# Patient Record
Sex: Male | Born: 1995 | Race: Black or African American | Hispanic: No | Marital: Single | Smoking: Never smoker
Health system: Southern US, Community
[De-identification: ages and names within clinical notes are randomized; demographics above are authoritative.]

## PROBLEM LIST (undated history)

## (undated) DIAGNOSIS — I10 Essential (primary) hypertension: Secondary | ICD-10-CM

## (undated) DIAGNOSIS — J45909 Unspecified asthma, uncomplicated: Secondary | ICD-10-CM

---

## 2020-11-11 ENCOUNTER — Emergency Department (HOSPITAL_COMMUNITY): Payer: Medicare HMO

## 2020-11-11 ENCOUNTER — Other Ambulatory Visit: Payer: Self-pay

## 2020-11-11 ENCOUNTER — Emergency Department (HOSPITAL_COMMUNITY)
Admission: EM | Admit: 2020-11-11 | Discharge: 2020-11-11 | Disposition: A | Discharge status: Home / Self Care | Payer: Medicare HMO | Attending: Emergency Medicine | Admitting: Emergency Medicine

## 2020-11-11 ENCOUNTER — Encounter (HOSPITAL_COMMUNITY): Payer: Self-pay

## 2020-11-11 DIAGNOSIS — Z5321 Procedure and treatment not carried out due to patient leaving prior to being seen by health care provider: Secondary | ICD-10-CM | POA: Diagnosis not present

## 2020-11-11 DIAGNOSIS — J45901 Unspecified asthma with (acute) exacerbation: Secondary | ICD-10-CM | POA: Diagnosis not present

## 2020-11-11 DIAGNOSIS — J45909 Unspecified asthma, uncomplicated: Secondary | ICD-10-CM

## 2020-11-11 HISTORY — DX: Unspecified asthma, uncomplicated: J45.909

## 2020-11-11 HISTORY — DX: Essential (primary) hypertension: I10

## 2020-11-11 LAB — CBC WITH DIFFERENTIAL/PLATELET
Abs Immature Granulocytes: 0.02 10*3/uL (ref 0.00–0.07)
Basophils Absolute: 0 10*3/uL (ref 0.0–0.1)
Basophils Relative: 0 %
Eosinophils Absolute: 0.1 10*3/uL (ref 0.0–0.5)
Eosinophils Relative: 1 %
HCT: 47.2 % (ref 39.0–52.0)
Hemoglobin: 16 g/dL (ref 13.0–17.0)
Immature Granulocytes: 0 %
Lymphocytes Relative: 28 %
Lymphs Abs: 2.2 10*3/uL (ref 0.7–4.0)
MCH: 31.7 pg (ref 26.0–34.0)
MCHC: 33.9 g/dL (ref 30.0–36.0)
MCV: 93.5 fL (ref 80.0–100.0)
Monocytes Absolute: 0.8 10*3/uL (ref 0.1–1.0)
Monocytes Relative: 10 %
Neutro Abs: 4.9 10*3/uL (ref 1.7–7.7)
Neutrophils Relative %: 61 %
Platelets: 274 10*3/uL (ref 150–400)
RBC: 5.05 MIL/uL (ref 4.22–5.81)
RDW: 13.1 % (ref 11.5–15.5)
WBC: 7.9 10*3/uL (ref 4.0–10.5)
nRBC: 0 % (ref 0.0–0.2)

## 2020-11-11 LAB — BASIC METABOLIC PANEL
Anion gap: 8 (ref 5–15)
BUN: 6 mg/dL (ref 6–20)
CO2: 26 mmol/L (ref 22–32)
Calcium: 8.8 mg/dL — ABNORMAL LOW (ref 8.9–10.3)
Chloride: 104 mmol/L (ref 98–111)
Creatinine, Ser: 1.23 mg/dL (ref 0.61–1.24)
GFR, Estimated: >60 mL/min (ref >60)
Glucose, Bld: 107 mg/dL — ABNORMAL HIGH (ref 70–99)
Potassium: 3.5 mmol/L (ref 3.5–5.1)
Sodium: 138 mmol/L (ref 135–145)

## 2020-11-11 LAB — TROPONIN I (HIGH SENSITIVITY): Troponin I (High Sensitivity): 6 ng/L (ref <18)

## 2020-11-11 MED ORDER — ALBUTEROL SULFATE HFA 108 (90 BASE) MCG/ACT IN AERS: 2.0000 | INHALATION_SPRAY | Freq: Once | RESPIRATORY_TRACT | Status: DC

## 2020-11-11 NOTE — ED Triage Notes (Signed)
Patient complains of asthma attack this am. States that he awoke wheezing, felt hot and coughing. Used inhaler with some relief and nebulizer broke. Patient in NAD

## 2020-11-11 NOTE — ED Provider Notes (Signed)
Emergency Medicine Provider Triage Evaluation Note  Jonathan Yates , a 25 y.o. male  was evaluated in triage.  Pt complains of asthma exacerbation.  Patient reports asthma attack began this morning, reports wheezing and a hot flash along with nonproductive cough.  Patient uses albuterol inhaler at home with some relief.  He then attempted to use his nebulizer but found that it was broken.  Review of Systems  Positive: Shortness of breath, cough Negative: Fever/chills, recent illness, difficulty swallowing, hemoptysis, chest pain, [name], vomiting, diarrhea, extremity swelling/color change or any additional concerns.  Physical Exam  BP (!) 150/103   Pulse 95   Temp 97.8 F (36.6 C) (Oral)   Resp (!) 26   SpO2 99%  Gen:   Awake, no distress  Resp:  Normal effort, slightly diminished lung sounds bilaterally. MSK:   Moves extremities without difficulty, no lower extremity edema Other:  Heart regular rate and rhythm  Medical Decision Making  Medically screening exam initiated at 9:15 AM.  Appropriate orders placed.  Jonathan Yates was informed that the remainder of the evaluation will be completed by another provider, this initial triage assessment does not replace that evaluation, and the importance of remaining in the ED until their evaluation is complete.   Note: Portions of this report may have been transcribed using voice recognition software. Every effort was made to ensure accuracy; however, inadvertent computerized transcription errors may still be present.    Elizabeth Palau 11/11/20 1779    Terrilee Files, MD 11/11/20 2112

## 2020-11-11 NOTE — ED Notes (Signed)
In lobby now

## 2020-11-11 NOTE — ED Notes (Signed)
Called no answer

## 2020-11-11 NOTE — ED Notes (Signed)
Pt not responding for vital recheck. 

## 2020-11-11 NOTE — ED Notes (Signed)
Pt didn't answer when called to recheck vitals  

## 2022-02-01 IMAGING — DX DG CHEST 2V
3 series · 3 of 3 positions shown · non-contrast
Comparison: None.

CLINICAL DATA: Asthma, shortness of breath

EXAM:
CHEST - 2 VIEW

[chest pa]
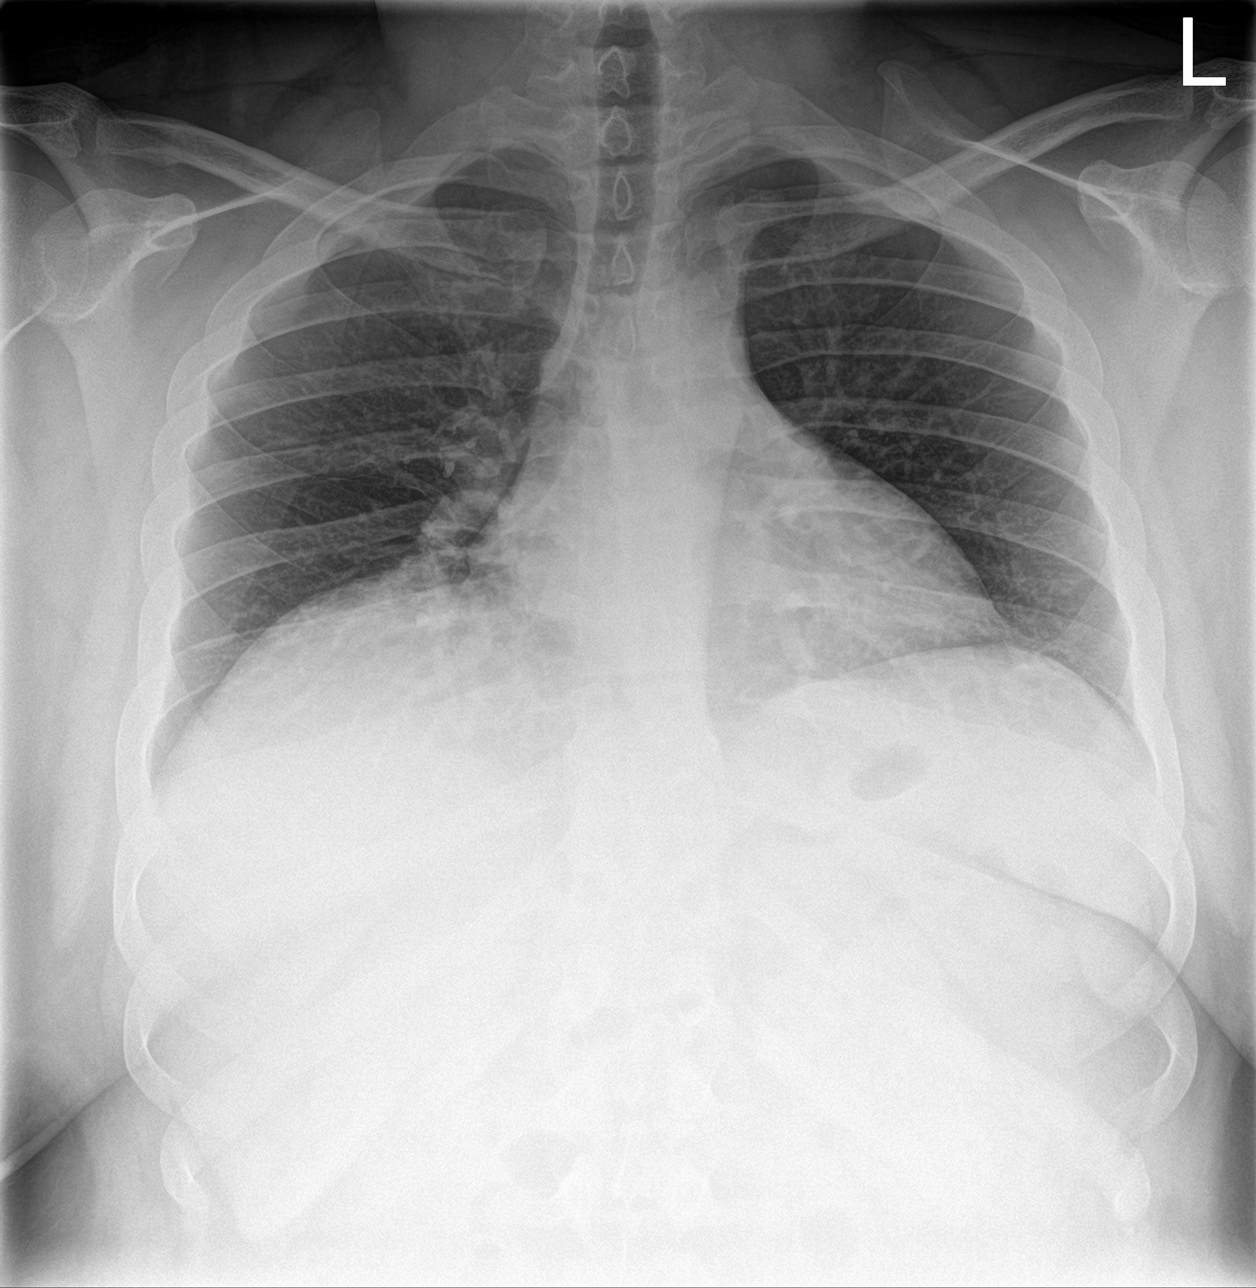

[chest lat (1 of 2)]
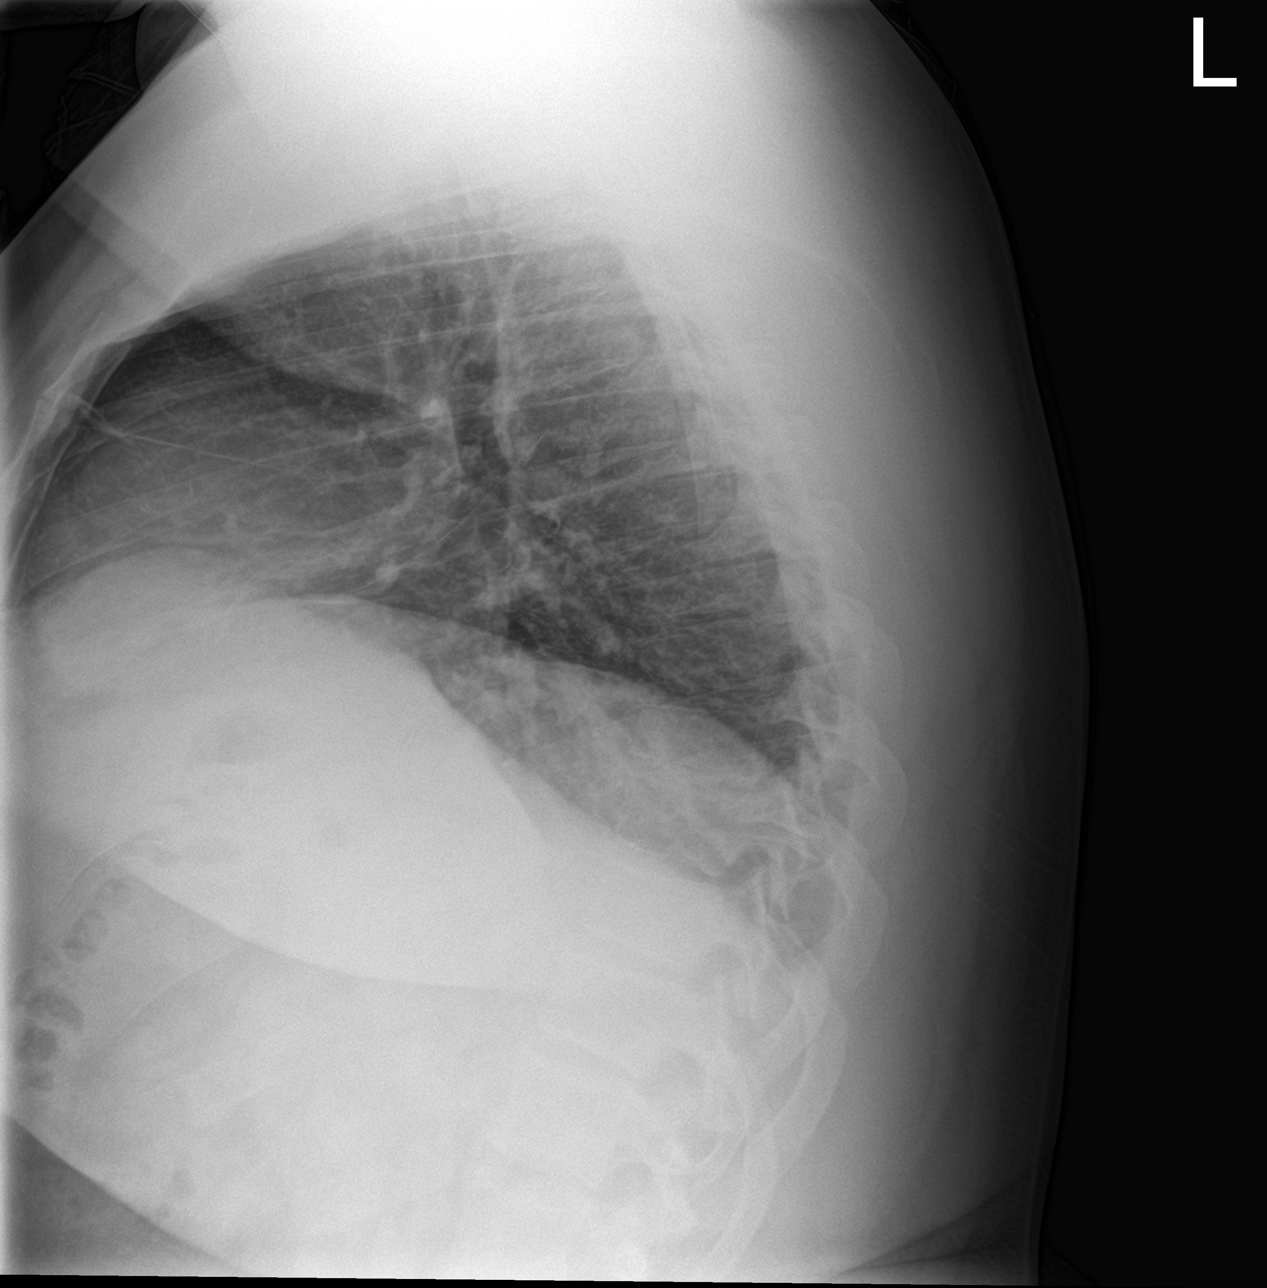

[chest lat (2 of 2)]
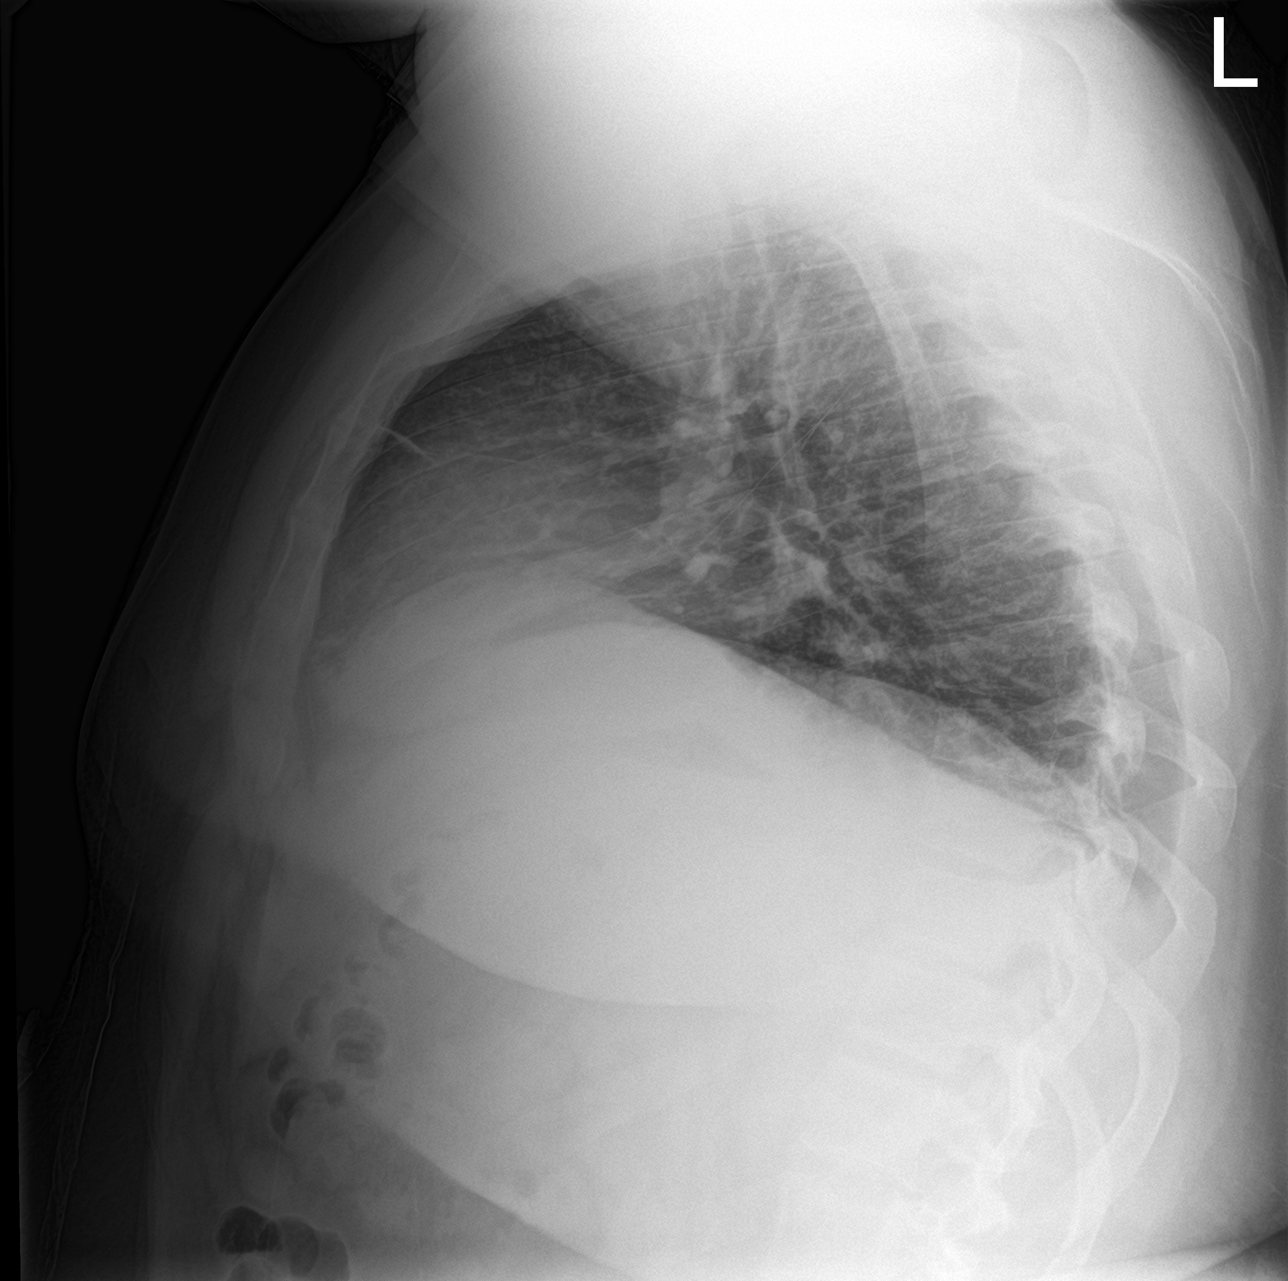

[3 of 3 positions shown; findings below may reference images not displayed]

FINDINGS: Very low lung volumes. No consolidation or edema. No pleural
effusion or pneumothorax. Cardiomediastinal contours are within
normal limits. No acute osseous abnormality.
IMPRESSION: Very low lung volumes.  No acute process in the chest.

## 2022-08-16 ENCOUNTER — Ambulatory Visit (HOSPITAL_COMMUNITY)
Admission: EM | Admit: 2022-08-16 | Discharge: 2022-08-16 | Disposition: A | Discharge status: Home / Self Care | Payer: 59

## 2022-08-16 ENCOUNTER — Encounter (HOSPITAL_COMMUNITY): Payer: Self-pay | Admitting: Emergency Medicine

## 2022-08-16 ENCOUNTER — Other Ambulatory Visit: Payer: Self-pay

## 2022-08-16 ENCOUNTER — Ambulatory Visit (INDEPENDENT_AMBULATORY_CARE_PROVIDER_SITE_OTHER): Payer: 59

## 2022-08-16 DIAGNOSIS — M542 Cervicalgia: Secondary | ICD-10-CM | POA: Diagnosis not present

## 2022-08-16 DIAGNOSIS — S161XXA Strain of muscle, fascia and tendon at neck level, initial encounter: Secondary | ICD-10-CM

## 2022-08-16 MED ORDER — PREDNISONE 10 MG (21) PO TBPK: ORAL_TABLET | Freq: Every day | ORAL | 0 refills | Status: AC

## 2022-08-16 MED ORDER — METHOCARBAMOL 500 MG PO TABS: 500.0000 mg | ORAL_TABLET | Freq: Three times a day (TID) | ORAL | 0 refills | Status: AC

## 2022-08-16 NOTE — Discharge Instructions (Addendum)
Advised patient of neck x-ray results with hardcopy provided.  Instructed patient to take medications as directed with food to completion.  Advised may use Robaxin daily or as needed for neck muscle spasms.  Encouraged increased daily water intake to 64 ounces per day while taking these medication.  Advised if symptoms worsen and/or unresolved please follow-up with PCP or here for further evaluation.
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
## 2022-08-20 ENCOUNTER — Emergency Department (HOSPITAL_COMMUNITY)
Admission: EM | Admit: 2022-08-20 | Discharge: 2022-08-20 | Disposition: A | Discharge status: Home / Self Care | Payer: 59 | Attending: Emergency Medicine | Admitting: Emergency Medicine

## 2022-08-20 ENCOUNTER — Encounter (HOSPITAL_COMMUNITY): Payer: Self-pay

## 2022-08-20 ENCOUNTER — Other Ambulatory Visit: Payer: Self-pay

## 2022-08-20 DIAGNOSIS — S161XXA Strain of muscle, fascia and tendon at neck level, initial encounter: Secondary | ICD-10-CM | POA: Diagnosis not present

## 2022-08-20 DIAGNOSIS — R519 Headache, unspecified: Secondary | ICD-10-CM

## 2022-08-20 DIAGNOSIS — S199XXA Unspecified injury of neck, initial encounter: Secondary | ICD-10-CM | POA: Diagnosis present

## 2022-08-20 DIAGNOSIS — Y92410 Unspecified street and highway as the place of occurrence of the external cause: Secondary | ICD-10-CM | POA: Diagnosis not present

## 2022-08-20 DIAGNOSIS — Z79899 Other long term (current) drug therapy: Secondary | ICD-10-CM | POA: Diagnosis not present

## 2022-08-20 DIAGNOSIS — I10 Essential (primary) hypertension: Secondary | ICD-10-CM | POA: Insufficient documentation

## 2022-08-20 MED ORDER — IBUPROFEN 200 MG PO TABS
400.0000 mg | ORAL_TABLET | Freq: Once | ORAL | Status: AC
  Administered 2022-08-20: 400 mg via ORAL
  Filled 2022-08-20: qty 2

## 2022-08-20 MED ORDER — AMLODIPINE BESYLATE 5 MG PO TABS
5.0000 mg | ORAL_TABLET | Freq: Once | ORAL | Status: AC
  Administered 2022-08-20: 5 mg via ORAL
  Filled 2022-08-20: qty 1

## 2022-08-20 MED ORDER — ACETAMINOPHEN 500 MG PO TABS
1000.0000 mg | ORAL_TABLET | Freq: Once | ORAL | Status: AC
  Administered 2022-08-20: 1000 mg via ORAL
  Filled 2022-08-20: qty 2

## 2022-08-20 NOTE — ED Notes (Signed)
Pt educated regarding HTN and importance of following up with PCP and getting norvasc from home. Pt states understanding.
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
## 2022-08-20 NOTE — Discharge Instructions (Addendum)
It was our pleasure to provide your ER care today - we hope that you feel better.  Take acetaminophen or ibuprofen as need for pain. Take your muscle relaxer (robaxin) as need, as previously prescribed.   Your blood pressure is high, continue your meds, follow heart health eating plan, and follow up closely with your doctor in the next 1-2 weeks.   Return to ER if worse, new symptoms, fevers, new/severe pain, severe headache, severe abdominal pain, trouble breathing, numbness/weakness, or other concern.

## 2022-10-02 ENCOUNTER — Emergency Department (HOSPITAL_COMMUNITY): Payer: 59

## 2022-10-02 ENCOUNTER — Emergency Department (HOSPITAL_COMMUNITY)
Admission: EM | Admit: 2022-10-02 | Discharge: 2022-10-02 | Disposition: A | Discharge status: Home / Self Care | Payer: 59 | Attending: Emergency Medicine | Admitting: Emergency Medicine

## 2022-10-02 ENCOUNTER — Telehealth (HOSPITAL_COMMUNITY): Payer: Self-pay | Admitting: Emergency Medicine

## 2022-10-02 ENCOUNTER — Encounter (HOSPITAL_COMMUNITY): Payer: Self-pay

## 2022-10-02 DIAGNOSIS — J45909 Unspecified asthma, uncomplicated: Secondary | ICD-10-CM | POA: Insufficient documentation

## 2022-10-02 DIAGNOSIS — Z79899 Other long term (current) drug therapy: Secondary | ICD-10-CM | POA: Insufficient documentation

## 2022-10-02 DIAGNOSIS — K0889 Other specified disorders of teeth and supporting structures: Secondary | ICD-10-CM

## 2022-10-02 DIAGNOSIS — K115 Sialolithiasis: Secondary | ICD-10-CM | POA: Diagnosis not present

## 2022-10-02 DIAGNOSIS — I10 Essential (primary) hypertension: Secondary | ICD-10-CM | POA: Diagnosis not present

## 2022-10-02 LAB — CBC WITH DIFFERENTIAL/PLATELET
Abs Immature Granulocytes: 0.04 10*3/uL (ref 0.00–0.07)
Basophils Absolute: 0.1 10*3/uL (ref 0.0–0.1)
Basophils Relative: 0 %
Eosinophils Absolute: 0.2 10*3/uL (ref 0.0–0.5)
Eosinophils Relative: 1 %
HCT: 47.5 % (ref 39.0–52.0)
Hemoglobin: 16.5 g/dL (ref 13.0–17.0)
Immature Granulocytes: 0 %
Lymphocytes Relative: 33 %
Lymphs Abs: 3.9 10*3/uL (ref 0.7–4.0)
MCH: 30.9 pg (ref 26.0–34.0)
MCHC: 34.7 g/dL (ref 30.0–36.0)
MCV: 89 fL (ref 80.0–100.0)
Monocytes Absolute: 0.8 10*3/uL (ref 0.1–1.0)
Monocytes Relative: 7 %
Neutro Abs: 6.8 10*3/uL (ref 1.7–7.7)
Neutrophils Relative %: 59 %
Platelets: 286 10*3/uL (ref 150–400)
RBC: 5.34 MIL/uL (ref 4.22–5.81)
RDW: 12 % (ref 11.5–15.5)
WBC: 11.7 10*3/uL — ABNORMAL HIGH (ref 4.0–10.5)
nRBC: 0 % (ref 0.0–0.2)

## 2022-10-02 LAB — BASIC METABOLIC PANEL
Anion gap: 11 (ref 5–15)
BUN: 9 mg/dL (ref 6–20)
CO2: 18 mmol/L — ABNORMAL LOW (ref 22–32)
Calcium: 7.5 mg/dL — ABNORMAL LOW (ref 8.9–10.3)
Chloride: 109 mmol/L (ref 98–111)
Creatinine, Ser: 1.09 mg/dL (ref 0.61–1.24)
GFR, Estimated: >60 mL/min (ref >60)
Glucose, Bld: 119 mg/dL — ABNORMAL HIGH (ref 70–99)
Potassium: 3 mmol/L — ABNORMAL LOW (ref 3.5–5.1)
Sodium: 138 mmol/L (ref 135–145)

## 2022-10-02 MED ORDER — LIDOCAINE VISCOUS HCL 2 % MT SOLN: 10.0000 mL | Freq: Four times a day (QID) | OROMUCOSAL | 0 refills | Status: AC | PRN

## 2022-10-02 MED ORDER — FENTANYL CITRATE PF 50 MCG/ML IJ SOSY
50.0000 ug | PREFILLED_SYRINGE | Freq: Once | INTRAMUSCULAR | Status: AC
  Administered 2022-10-02: 50 ug via INTRAVENOUS
  Filled 2022-10-02: qty 1

## 2022-10-02 MED ORDER — BUPIVACAINE-EPINEPHRINE (PF) 0.5% -1:200000 IJ SOLN
1.8000 mL | Freq: Once | INTRAMUSCULAR | Status: AC
  Administered 2022-10-02: 1.8 mL
  Filled 2022-10-02: qty 1.8

## 2022-10-02 MED ORDER — KETOROLAC TROMETHAMINE 15 MG/ML IJ SOLN
15.0000 mg | Freq: Once | INTRAMUSCULAR | Status: AC
  Administered 2022-10-02: 15 mg via INTRAVENOUS
  Filled 2022-10-02: qty 1

## 2022-10-02 MED ORDER — PENICILLIN V POTASSIUM 500 MG PO TABS
500.0000 mg | ORAL_TABLET | Freq: Once | ORAL | Status: AC
  Administered 2022-10-02: 500 mg via ORAL
  Filled 2022-10-02: qty 1

## 2022-10-02 MED ORDER — PENICILLIN V POTASSIUM 500 MG PO TABS: 500.0000 mg | ORAL_TABLET | Freq: Four times a day (QID) | ORAL | 0 refills | Status: AC

## 2022-10-02 MED ORDER — IOHEXOL 300 MG/ML  SOLN
100.0000 mL | Freq: Once | INTRAMUSCULAR | Status: AC | PRN
  Administered 2022-10-02: 100 mL via INTRAVENOUS

## 2022-10-02 MED ORDER — ACETAMINOPHEN 500 MG PO TABS
1000.0000 mg | ORAL_TABLET | Freq: Once | ORAL | Status: AC
  Administered 2022-10-02: 1000 mg via ORAL
  Filled 2022-10-02: qty 2

## 2022-10-02 MED ORDER — PENICILLIN V POTASSIUM 500 MG PO TABS: 500.0000 mg | ORAL_TABLET | Freq: Four times a day (QID) | ORAL | 0 refills | Status: DC

## 2022-10-02 MED ORDER — SODIUM CHLORIDE (PF) 0.9 % IJ SOLN
INTRAMUSCULAR | Status: AC
  Filled 2022-10-02: qty 50
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
## 2022-10-02 NOTE — Telephone Encounter (Signed)
Received call from pharmacist that prescription written by previous provider has inconsistency with number of pills written for vs prescription instructions. Prescription written for penicillin V 500 mg QID x 7 days. 40 pills written. Will attempt to discontinue previous order in correction.   Pharmacist made aware via phone.

## 2022-10-02 NOTE — Discharge Instructions (Signed)
For pain control you may take 1000 mg of Tylenol every 8 hours scheduled.  In addition you can take 600 mg of Motrin every 6 - 8 hours as needed for pain not controlled with the scheduled Tylenol.

## 2022-10-02 NOTE — ED Provider Notes (Signed)
EMERGENCY DEPARTMENT AT [facility] Provider Note  CSN: [id number] Arrival date & time: 10/02/22 8413  Chief Complaint(s) Dental Pain  HPI Jonathan Yates is a 27 y.o. male with a past medical history listed below including hypertension and asthma chronic steroids resulting in dental decay requiring several dental extractions last of which was 6 months ago who presents to the emergency department with several days of left lower dental pain that became severe.  Patient feels like he is developing swelling underneath his jaw and is concerned for infection.  Pain is worse with palpation.  No noted fevers or chills.  No coughing or congestion.  No nausea or vomiting.  No other physical complaints.  The history is provided by the patient.    Past Medical History Past Medical History:  Diagnosis Date   Asthma    Hypertension    There are no problems to display for this patient.  Home Medication(s) Prior to Admission medications   Medication Sig Start Date End Date Taking? Authorizing Provider  albuterol (PROVENTIL) (2.5 MG/3ML) 0.083% nebulizer solution Take 2.5 mg by nebulization every 6 (six) hours as needed for wheezing or shortness of breath. 02/26/22  Yes [provider]  albuterol (VENTOLIN HFA) 108 (90 Base) MCG/ACT inhaler Inhale 1-2 puffs into the lungs every 6 (six) hours as needed for wheezing or shortness of breath. 02/26/22 04/13/23 Yes [provider]  amLODipine (NORVASC) 5 MG tablet Take 5 mg by mouth daily. 05/19/21 03/23/23 Yes [provider]  beclomethasone (QVAR REDIHALER) 80 MCG/ACT inhaler Inhale 1 puff into the lungs 2 (two) times daily. 04/15/22 04/15/23 Yes [provider]  penicillin v potassium (VEETID) 500 MG tablet Take 1 tablet (500 mg total) by mouth 4 (four) times daily for 7 days. 10/02/22 10/09/22 Yes Bertran Zeimet, Amadeo Garnet, MD  sertraline (ZOLOFT) 50 MG tablet Take 50 mg by mouth at bedtime. 05/19/21   Yes [provider]  Tiotropium Bromide Monohydrate 2.5 MCG/ACT AERS Inhale 2 each into the lungs daily. 01/05/22 04/13/23 Yes [provider]  atorvastatin (LIPITOR) 20 MG tablet Take 20 mg by mouth daily. 03/23/22   [provider]  methocarbamol (ROBAXIN) 500 MG tablet Take 1 tablet (500 mg total) by mouth 3 (three) times daily. Patient not taking: Reported on 10/02/2022 08/16/22   Trevor Iha, FNP  predniSONE (STERAPRED UNI-PAK 21 TAB) 10 MG (21) TBPK tablet Take by mouth daily. Take 6 tabs by mouth daily  for 2 days, then 5 tabs for 2 days, then 4 tabs for 2 days, then 3 tabs for 2 days, 2 tabs for 2 days, then 1 tab by mouth daily for 2 days Patient not taking: Reported on 10/02/2022 08/16/22   Trevor Iha, FNP  Allergies Shellfish allergy  Review of Systems Review of Systems As noted in HPI  Physical Exam Vital Signs  I have reviewed the triage vital signs BP (!) 148/89   Pulse 78   Temp (!) 100.9 F (38.3 C) (Oral)   Resp 19   Ht 6' (1.829 m)   Wt 136.1 kg   SpO2 95%   BMI 40.69 kg/m   Physical Exam Vitals reviewed.  Constitutional:      General: He is not in acute distress.    Appearance: He is well-developed. He is not diaphoretic.  HENT:     Head: Normocephalic and atraumatic.     Right Ear: External ear normal.     Left Ear: External ear normal.     Nose: Nose normal.     Mouth/Throat:     Mouth: Mucous membranes are moist.     Dentition: Abnormal dentition.     Pharynx: No pharyngeal swelling or posterior oropharyngeal erythema.   Eyes:     General: No scleral icterus.    Conjunctiva/sclera: Conjunctivae normal.  Neck:     Trachea: Phonation normal.  Cardiovascular:     Rate and Rhythm: Normal rate and regular rhythm.  Pulmonary:     Effort: Pulmonary effort is normal. No respiratory distress.      Breath sounds: No stridor.  Abdominal:     General: There is no distension.  Musculoskeletal:        General: Normal range of motion.     Cervical back: Normal range of motion.  Lymphadenopathy:     Head:     Left side of head: Submandibular adenopathy present.  Neurological:     Mental Status: He is alert and oriented to person, place, and time.  Psychiatric:        Behavior: Behavior normal.     ED Results and Treatments Labs (all labs ordered are listed, but only abnormal results are displayed) Labs Reviewed  CBC WITH DIFFERENTIAL/PLATELET - Abnormal; Notable for the following components:      Result Value   WBC 11.7 (*)    All other components within normal limits  BASIC METABOLIC PANEL - Abnormal; Notable for the following components:   Potassium 3.0 (*)    CO2 18 (*)    Glucose, Bld 119 (*)    Calcium 7.5 (*)    All other components within normal limits                                                                                                                         EKG  EKG Interpretation  Date/Time:    Ventricular Rate:    PR Interval:    QRS Duration:   QT Interval:    QTC Calculation:   R Axis:     Text Interpretation:         Radiology DG Chest Port 1 View  Result Date: 10/02/2022 CLINICAL DATA:  Cough with asthma EXAM: PORTABLE CHEST 1 VIEW COMPARISON:  11/11/2020 FINDINGS: Normal heart size and mediastinal contours. Low volume chest. Stable accentuated markings at the right lung base. No acute infiltrate or edema. No effusion or pneumothorax. No acute osseous findings. IMPRESSION: Stable from prior.  No acute finding. Electronically Signed   By: Tiburcio Pea M.D.   On: 10/02/2022 06:08   CT Soft Tissue Neck W Contrast  Result Date: 10/02/2022 CLINICAL DATA:  Soft tissue infection suspected. EXAM: CT NECK WITH CONTRAST TECHNIQUE: Multidetector CT imaging of the neck was performed using the standard protocol following the bolus administration of  intravenous contrast. RADIATION DOSE REDUCTION: This exam was performed according to the departmental dose-optimization program which includes automated exposure control, adjustment of the mA and/or kV according to patient size and/or use of iterative reconstruction technique. CONTRAST:  OMNIPAQUE IOHEXOL 300 MG/ML  SOLN COMPARISON:  None Available. FINDINGS: Pharynx and larynx: No evidence of mass or inflammation Salivary glands: There is a 3 mm stone at the proximal left submandibular duct with intra parenchymal ductal distension to a mild degree. No adjacent fat reticulation or platysma thickening. Certainly no abscess. Thyroid: Normal. Lymph nodes: None enlarged or abnormal density. Vascular: Negative. Limited intracranial: Negative. Visualized orbits: Negative. Mastoids and visualized paranasal sinuses: Clear. Skeleton: Unremarkable Upper chest: Clear apical lungs IMPRESSION: 3 mm stone at the proximal left submandibular duct with minor ductal distension. No acute inflammation. Electronically Signed   By: Tiburcio Pea M.D.   On: 10/02/2022 05:18    Medications Ordered in ED Medications  bupivacaine-epinephrine (PF) (MARCAINE W/ EPI) 0.5% -1:200000 injection 1.8 mL (1.8 mLs Infiltration Given by Other 10/02/22 0558)  fentaNYL (SUBLIMAZE) injection 50 mcg (50 mcg Intravenous Given 10/02/22 0413)  iohexol (OMNIPAQUE) 300 MG/ML solution 100 mL (100 mLs Intravenous Contrast Given 10/02/22 0506)  acetaminophen (TYLENOL) tablet 1,000 mg (1,000 mg Oral Given 10/02/22 0614)  ketorolac (TORADOL) 15 MG/ML injection 15 mg (15 mg Intravenous Given 10/02/22 0614)  penicillin v potassium (VEETID) tablet 500 mg (500 mg Oral Given 10/02/22 4098)                                                                                                                                     Procedures Dental Block  Date/Time: 10/02/2022 6:30 AM  Performed by: Nira Conn, MD Authorized by: Nira Conn,  MD   Consent:    Consent obtained:  Verbal   Consent given by:  Patient   Risks discussed:  Infection, allergic reaction, intravascular injection, pain, nerve damage and unsuccessful block   Alternatives discussed:  No treatment and alternative treatment Universal protocol:    Patient identity confirmed:  Verbally with patient and arm band Indications:    Indications: dental pain   Location:    Block type:  Inferior alveolar   Laterality:  Left Procedure details:    Needle gauge:  27 G   Anesthetic injected:  Bupivacaine 0.5% WITH epi  Injection procedure:  Anatomic landmarks identified, introduced needle, incremental injection, anatomic landmarks palpated and negative aspiration for blood Post-procedure details:    Outcome:  Anesthesia achieved   Procedure completion:  Tolerated   (including critical care time)  Medical Decision Making / ED Course  Click here for ABCD2, HEART and other calculators  Medical Decision Making Amount and/or Complexity of Data Reviewed Labs: ordered. Radiology: ordered.  Risk OTC drugs. Prescription drug management.    Patient presents with left lower dental pain with submandibular swelling. Concerning for deep tissue abscess given patient is low-grade fever. Patient provided with IV pain medicine and had a dental block achieving pain relief. CT soft tissue neck negative for any deep tissue infection but did reveal a 3 mm submandibular salivary duct stone. Patient does have poor dentition and tenderness to palpation of the teeth. Will treat for pulpitis with Pen-VK.  Chest x-ray obtained and negative for pneumonia.  No other signs of infectious process.      Final Clinical Impression(s) / ED Diagnoses Final diagnoses:  Salivary duct stones  Pain, dental   The patient appears reasonably screened and/or stabilized for discharge and I doubt any other medical condition or other [facility] requiring further screening, evaluation, or treatment in  the ED at this time. I have discussed the findings, Dx and Tx plan with the patient/family who expressed understanding and agree(s) with the plan. Discharge instructions discussed at length. The patient/family was given strict return precautions who verbalized understanding of the instructions. No further questions at time of discharge.  Disposition: Discharge  Condition: Good  ED Discharge Orders          Ordered    penicillin v potassium (VEETID) 500 MG tablet  4 times daily        10/02/22 1610             Follow Up: Merlene Pulling, MD [address] [place] [address] [phone]  Call  to schedule an appointment for close follow up           This chart was dictated using voice recognition software.  Despite best efforts to proofread,  errors can occur which can change the documentation meaning.    Nira Conn, MD 10/02/22 [phone]

## 2023-01-30 ENCOUNTER — Emergency Department (HOSPITAL_COMMUNITY)
Admission: EM | Admit: 2023-01-30 | Discharge: 2023-01-30 | Disposition: A | Discharge status: Home / Self Care | Payer: 59 | Attending: Emergency Medicine | Admitting: Emergency Medicine

## 2023-01-30 ENCOUNTER — Other Ambulatory Visit: Payer: Self-pay

## 2023-01-30 DIAGNOSIS — Z79899 Other long term (current) drug therapy: Secondary | ICD-10-CM | POA: Diagnosis not present

## 2023-01-30 DIAGNOSIS — I10 Essential (primary) hypertension: Secondary | ICD-10-CM | POA: Insufficient documentation

## 2023-01-30 DIAGNOSIS — J45901 Unspecified asthma with (acute) exacerbation: Secondary | ICD-10-CM | POA: Insufficient documentation

## 2023-01-30 DIAGNOSIS — Z7951 Long term (current) use of inhaled steroids: Secondary | ICD-10-CM | POA: Insufficient documentation

## 2023-01-30 DIAGNOSIS — R0602 Shortness of breath: Secondary | ICD-10-CM | POA: Diagnosis present

## 2023-01-30 MED ORDER — IPRATROPIUM BROMIDE 0.02 % IN SOLN
0.5000 mg | Freq: Once | RESPIRATORY_TRACT | Status: AC
  Administered 2023-01-30: 0.5 mg via RESPIRATORY_TRACT
  Filled 2023-01-30: qty 2.5

## 2023-01-30 MED ORDER — DEXAMETHASONE SODIUM PHOSPHATE 10 MG/ML IJ SOLN
10.0000 mg | Freq: Once | INTRAMUSCULAR | Status: AC
  Administered 2023-01-30: 10 mg via INTRAMUSCULAR
  Filled 2023-01-30: qty 1

## 2023-01-30 MED ORDER — ALBUTEROL SULFATE (2.5 MG/3ML) 0.083% IN NEBU
5.0000 mg | INHALATION_SOLUTION | Freq: Once | RESPIRATORY_TRACT | Status: AC
  Administered 2023-01-30: 5 mg via RESPIRATORY_TRACT
  Filled 2023-01-30: qty 6

## 2023-01-30 NOTE — Discharge Instructions (Addendum)
Continue your home albuterol treatments as prescribed.  Follow-up with your primary care doctor as well as your allergist/immunologist.  Return to the ED for new or concerning symptoms.

## 2023-01-30 NOTE — ED Provider Notes (Signed)
[place] EMERGENCY DEPARTMENT AT [facility] Provider Note   CSN: [id number] Arrival date & time: 01/30/23  0235     History {Add pertinent medical, surgical, social history, OB history to HPI:1} Chief Complaint  Patient presents with   Asthma    Jonathan Yates is a 27 y.o. male.  27 year old male with a history of asthma and hypertension presents to the emergency department for shortness of breath.  He began noticing worsening symptoms around midnight tonight.  Tried his home albuterol inhaler without significant improvement.  He was recently on a course of prednisone which was completed 2 days ago.  Is actively followed by allergy and immunology.  Has a history of hospitalizations and intubations secondary to asthma exacerbations.  Denies any recent fevers.  No hemoptysis, leg swelling.  Overall, he is feeling better at this time and thinks that some of his home albuterol is finally helping with symptomatic relief.  The history is provided by the patient and the spouse. No language interpreter was used.  Asthma       Home Medications Prior to Admission medications   Medication Sig Start Date End Date Taking? Authorizing Provider  albuterol (PROVENTIL) (2.5 MG/3ML) 0.083% nebulizer solution Take 2.5 mg by nebulization every 6 (six) hours as needed for wheezing or shortness of breath. 02/26/22  Yes [provider]  amLODipine (NORVASC) 5 MG tablet Take 5 mg by mouth daily. 05/19/21 03/23/23 Yes [provider]  beclomethasone (QVAR REDIHALER) 80 MCG/ACT inhaler Inhale 1 puff into the lungs 2 (two) times daily. 04/15/22 04/15/23 Yes [provider]  escitalopram (LEXAPRO) 10 MG tablet Take 1 tablet by mouth daily. 01/11/23 01/11/24 Yes [provider]  fluticasone (FLONASE) 50 MCG/ACT nasal spray Place 2 sprays into the nose daily. 07/29/21  Yes [provider]  levocetirizine (XYZAL) 5 MG tablet Take 1 tablet by mouth daily.  12/07/22 12/07/23 Yes [provider]  Tiotropium Bromide Monohydrate 2.5 MCG/ACT AERS Inhale 2 each into the lungs daily. 01/05/22 04/13/23 Yes [provider]  albuterol (VENTOLIN HFA) 108 (90 Base) MCG/ACT inhaler Inhale 1-2 puffs into the lungs every 6 (six) hours as needed for wheezing or shortness of breath. 02/26/22 04/13/23  [provider]  atorvastatin (LIPITOR) 20 MG tablet Take 20 mg by mouth daily. 03/23/22   [provider]  lidocaine (XYLOCAINE) 2 % solution Use as directed 10 mLs in the mouth or throat every 6 (six) hours as needed (stomach pain). Patient not taking: Reported on 01/30/2023 10/02/22   Nira Conn, MD  methocarbamol (ROBAXIN) 500 MG tablet Take 1 tablet (500 mg total) by mouth 3 (three) times daily. Patient not taking: Reported on 10/02/2022 08/16/22   Trevor Iha, FNP  predniSONE (DELTASONE) 20 MG tablet Take 20 mg by mouth See admin instructions. Take by mouth daily. Take 6 tabs by mouth daily  for 2 days, then 5 tabs for 2 days, then 4 tabs for 2 days, then 3 tabs for 2 days, 2 tabs for 2 days, then 1 tab by mouth daily for 2 days Patient not taking: Reported on 01/30/2023 01/12/23   [provider]  predniSONE (STERAPRED UNI-PAK 21 TAB) 10 MG (21) TBPK tablet Take by mouth daily. Take 6 tabs by mouth daily  for 2 days, then 5 tabs for 2 days, then 4 tabs for 2 days, then 3 tabs for 2 days, 2 tabs for 2 days, then 1 tab by mouth daily for 2 days Patient not  taking: Reported on 10/02/2022 08/16/22   Trevor Iha, FNP  sertraline (ZOLOFT) 50 MG tablet Take 50 mg by mouth at bedtime. Patient not taking: Reported on 01/30/2023 05/19/21   [provider]      Allergies    Shellfish allergy    Review of Systems   Review of Systems Ten systems reviewed and are negative for acute change, except as noted in the HPI.    Physical Exam Updated Vital Signs BP (!) 177/99   Pulse 80   Temp 98 F (36.7 C)   Resp 18    SpO2 96%   Physical Exam Vitals and nursing note reviewed.  Constitutional:      General: He is not in acute distress.    Appearance: He is well-developed. He is not diaphoretic.     Comments: Nontoxic-appearing in no acute distress.  Morbidly obese AA male.  HENT:     Head: Normocephalic and atraumatic.  Eyes:     General: No scleral icterus.    Conjunctiva/sclera: Conjunctivae normal.  Cardiovascular:     Rate and Rhythm: Normal rate and regular rhythm.     Pulses: Normal pulses.  Pulmonary:     Effort: Pulmonary effort is normal. No respiratory distress.     Breath sounds: No stridor. No wheezing or rales.     Comments: No overt wheezing or respiratory distress.  Moving air fairly well on exam. Musculoskeletal:        General: Normal range of motion.     Cervical back: Normal range of motion.  Skin:    General: Skin is warm and dry.     Coloration: Skin is not pale.     Findings: No erythema or rash.  Neurological:     Mental Status: He is alert and oriented to person, place, and time.     Coordination: Coordination normal.  Psychiatric:        Behavior: Behavior normal.     ED Results / Procedures / Treatments   Labs (all labs ordered are listed, but only abnormal results are displayed) Labs Reviewed - No data to display  EKG None  Radiology No results found.  Procedures Procedures  {Document cardiac monitor, telemetry assessment procedure when appropriate:1}  Medications Ordered in ED Medications  dexamethasone (DECADRON) injection 10 mg (10 mg Intramuscular Given 01/30/23 0303)  albuterol (PROVENTIL) (2.5 MG/3ML) 0.083% nebulizer solution 5 mg (5 mg Nebulization Given 01/30/23 0304)  ipratropium (ATROVENT) nebulizer solution 0.5 mg (0.5 mg Nebulization Given 01/30/23 0304)    ED Course/ Medical Decision Making/ A&P Clinical Course as of 01/30/23 0616  Mon Jan 30, 2023  0407 Patient feeling much better following nebulizer treatment.  Lungs are clear on  reauscultation.  Will observe for 30 to 45 minutes to ensure no worsening shortness of breath, rebound. [KH]  [phone] Patient is feeling better.  Oxygen saturations 95% on room air at rest. [KH]    Clinical Course User Index [KH] Antony Madura, PA-C   {   Click here for ABCD2, HEART and other calculatorsREFRESH Note before signing :1}                              Medical Decision Making Risk Prescription drug management.   ***  {Document critical care time when appropriate:1} {Document review of labs and clinical decision tools ie heart score, Chads2Vasc2 etc:1}  {Document your independent review of radiology images, and any outside records:1} {Document your  discussion with family members, caretakers, and with consultants:1} {Document social determinants of health affecting pt's care:1} {Document your decision making why or why not admission, treatments were needed:1} Final Clinical Impression(s) / ED Diagnoses Final diagnoses:  Exacerbation of asthma, unspecified asthma severity, unspecified whether persistent    Rx / DC Orders ED Discharge Orders     None

## 2023-01-30 NOTE — ED Triage Notes (Signed)
Patient arrived with complaints of asthma exacerbation that started tonight around midnight. States he used his inhaler but it is running low and did not feel any relief. Declines any chest pain at this time or any other symptoms.
